# Patient Record
Sex: Female | Born: 1950
Health system: Southern US, Community
[De-identification: ages and names within clinical notes are randomized; demographics above are authoritative.]

## PROBLEM LIST (undated history)

## (undated) DIAGNOSIS — R011 Cardiac murmur, unspecified: Secondary | ICD-10-CM

## (undated) DIAGNOSIS — T7840XA Allergy, unspecified, initial encounter: Secondary | ICD-10-CM

## (undated) DIAGNOSIS — D649 Anemia, unspecified: Secondary | ICD-10-CM

## (undated) HISTORY — DX: Allergy, unspecified, initial encounter: T78.40XA

## (undated) HISTORY — DX: Anemia, unspecified: D64.9

## (undated) HISTORY — DX: Cardiac murmur, unspecified: R01.1

---

## 1969-08-19 HISTORY — PX: SHOULDER SURGERY: SHX246

## 1973-12-19 HISTORY — PX: BREAST BIOPSY: SHX20

## 1998-07-23 ENCOUNTER — Other Ambulatory Visit: Admission: RE | Admit: 1998-07-23 | Discharge: 1998-07-23 | Payer: Self-pay | Admitting: Gynecology

## 1999-07-27 ENCOUNTER — Other Ambulatory Visit: Admission: RE | Admit: 1999-07-27 | Discharge: 1999-07-27 | Payer: Self-pay | Admitting: Gynecology

## 2000-03-03 ENCOUNTER — Encounter: Payer: Self-pay | Admitting: Gynecology

## 2000-03-03 ENCOUNTER — Encounter: Admission: RE | Admit: 2000-03-03 | Discharge: 2000-03-03 | Payer: Self-pay | Admitting: Gynecology

## 2001-03-09 ENCOUNTER — Ambulatory Visit (HOSPITAL_COMMUNITY): Admission: RE | Admit: 2001-03-09 | Discharge: 2001-03-09 | Payer: Self-pay | Admitting: Gynecology

## 2001-03-09 ENCOUNTER — Encounter: Payer: Self-pay | Admitting: Gynecology

## 2001-06-29 ENCOUNTER — Other Ambulatory Visit: Admission: RE | Admit: 2001-06-29 | Discharge: 2001-06-29 | Payer: Self-pay | Admitting: Obstetrics and Gynecology

## 2002-03-14 ENCOUNTER — Ambulatory Visit (HOSPITAL_COMMUNITY): Admission: RE | Admit: 2002-03-14 | Discharge: 2002-03-14 | Payer: Self-pay | Admitting: Obstetrics and Gynecology

## 2002-03-14 ENCOUNTER — Encounter: Payer: Self-pay | Admitting: Obstetrics and Gynecology

## 2002-07-02 ENCOUNTER — Other Ambulatory Visit: Admission: RE | Admit: 2002-07-02 | Discharge: 2002-07-02 | Payer: Self-pay | Admitting: Obstetrics and Gynecology

## 2003-03-17 ENCOUNTER — Ambulatory Visit (HOSPITAL_COMMUNITY): Admission: RE | Admit: 2003-03-17 | Discharge: 2003-03-17 | Payer: Self-pay | Admitting: Family Medicine

## 2003-03-17 ENCOUNTER — Encounter: Payer: Self-pay | Admitting: Family Medicine

## 2003-07-15 ENCOUNTER — Other Ambulatory Visit: Admission: RE | Admit: 2003-07-15 | Discharge: 2003-07-15 | Payer: Self-pay | Admitting: Obstetrics and Gynecology

## 2004-03-18 ENCOUNTER — Ambulatory Visit (HOSPITAL_COMMUNITY): Admission: RE | Admit: 2004-03-18 | Discharge: 2004-03-18 | Payer: Self-pay | Admitting: Obstetrics and Gynecology

## 2004-08-05 ENCOUNTER — Other Ambulatory Visit: Admission: RE | Admit: 2004-08-05 | Discharge: 2004-08-05 | Payer: Self-pay | Admitting: Obstetrics and Gynecology

## 2005-03-28 ENCOUNTER — Ambulatory Visit (HOSPITAL_COMMUNITY): Admission: RE | Admit: 2005-03-28 | Discharge: 2005-03-28 | Payer: Self-pay | Admitting: Obstetrics and Gynecology

## 2005-08-08 ENCOUNTER — Other Ambulatory Visit: Admission: RE | Admit: 2005-08-08 | Discharge: 2005-08-08 | Payer: Self-pay | Admitting: Obstetrics and Gynecology

## 2005-09-22 ENCOUNTER — Ambulatory Visit: Payer: Self-pay | Admitting: Internal Medicine

## 2005-10-07 ENCOUNTER — Encounter (INDEPENDENT_AMBULATORY_CARE_PROVIDER_SITE_OTHER): Payer: Self-pay | Admitting: *Deleted

## 2005-10-07 ENCOUNTER — Ambulatory Visit: Payer: Self-pay | Admitting: Internal Medicine

## 2006-03-30 ENCOUNTER — Ambulatory Visit (HOSPITAL_COMMUNITY): Admission: RE | Admit: 2006-03-30 | Discharge: 2006-03-30 | Payer: Self-pay | Admitting: Obstetrics and Gynecology

## 2006-12-19 HISTORY — PX: HAND SURGERY: SHX662

## 2007-04-18 ENCOUNTER — Ambulatory Visit (HOSPITAL_COMMUNITY): Admission: RE | Admit: 2007-04-18 | Discharge: 2007-04-18 | Payer: Self-pay | Admitting: Obstetrics and Gynecology

## 2008-02-07 ENCOUNTER — Encounter (INDEPENDENT_AMBULATORY_CARE_PROVIDER_SITE_OTHER): Payer: Self-pay | Admitting: Orthopedic Surgery

## 2008-02-07 ENCOUNTER — Ambulatory Visit (HOSPITAL_BASED_OUTPATIENT_CLINIC_OR_DEPARTMENT_OTHER): Admission: RE | Admit: 2008-02-07 | Discharge: 2008-02-07 | Payer: Self-pay | Admitting: Orthopedic Surgery

## 2008-04-24 ENCOUNTER — Ambulatory Visit (HOSPITAL_COMMUNITY): Admission: RE | Admit: 2008-04-24 | Discharge: 2008-04-24 | Payer: Self-pay | Admitting: Obstetrics and Gynecology

## 2009-05-05 ENCOUNTER — Ambulatory Visit (HOSPITAL_COMMUNITY): Admission: RE | Admit: 2009-05-05 | Discharge: 2009-05-05 | Payer: Self-pay | Admitting: Obstetrics and Gynecology

## 2010-05-06 ENCOUNTER — Ambulatory Visit (HOSPITAL_COMMUNITY): Admission: RE | Admit: 2010-05-06 | Discharge: 2010-05-06 | Payer: Self-pay | Admitting: Obstetrics and Gynecology

## 2010-08-20 ENCOUNTER — Encounter: Payer: Self-pay | Admitting: Internal Medicine

## 2011-01-18 NOTE — Letter (Signed)
Summary: Colonoscopy Letter  [facility]  [address] [place], [place] [address]   Phone: [phone]  Fax: [phone]      August 20, 2010 MRN: [id number]   Donna Bauer [address] [place], [place]  [address]   Dear Donna Bauer,   According to your medical record, it is time for you to schedule a Colonoscopy. The American Cancer Society recommends this procedure as a method to detect early colon cancer. Patients with a family history of colon cancer, or a personal history of colon polyps or inflammatory bowel disease are at increased risk.  This letter has been generated based on the recommendations made at the time of your procedure. If you feel that in your particular situation this may no longer apply, please contact our office.  Please call our office at [phone] to schedule this appointment or to update your records at your earliest convenience.  Thank you for cooperating with [place] to provide you with the very best care possible.   Sincerely,  Donna Bauer, M.D.  [facility] [phone]

## 2011-04-11 ENCOUNTER — Other Ambulatory Visit (HOSPITAL_COMMUNITY): Payer: Self-pay | Admitting: Obstetrics and Gynecology

## 2011-04-11 DIAGNOSIS — Z1231 Encounter for screening mammogram for malignant neoplasm of breast: Secondary | ICD-10-CM

## 2011-05-03 NOTE — Op Note (Signed)
[name], Bauer                ACCOUNT NO.:  [id number]   MEDICAL RECORD NO.:  [id number]          PATIENT TYPE:  AMB   LOCATION:  DSC                          FACILITY:  [facility]   PHYSICIAN:  Katy Fitch. Sypher, M.D. DATE OF BIRTH:  Oct 11, 1951   DATE OF PROCEDURE:  02/07/2008  DATE OF DISCHARGE:                               OPERATIVE REPORT   PREOPERATIVE DIAGNOSIS:  Chronic synovitis, right index finger, status  post possible thorn injury in October 2008.   POSTOPERATIVE DIAGNOSIS:  Chronic synovitis, rule out atypical infection  versus crystal synovitis.   OPERATION:  Synovectomy of flexor digitorum profundus and flexor  digitorum superficialis tendons, right index finger, with multiple  biopsy specimens sent for routine histopathology, in absolute alcohol  for crystal examination, aerobic and anaerobic culture, AFB and fungal  culture, AFB and fungal smear.   SURGEON:  Katy Fitch. Sypher, M.D.   ASSISTANT:  Marveen Reeks. Dasnoit, P.A.-C.   ANESTHESIA:  General by LMA.   SUPERVISING ANESTHESIOLOGIST:  Quita Skye. Krista Blue, M.D.   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  She has a history of sustaining a  possible penetrating injury to her right index finger in October 2008  followed by the onset of swelling over the flexor sheath.  She is well  acquainted with Dr. Vear Clock, a semi-retired orthopedic surgeon,  who has seen her socially during the fall of 2008.  He noted that she  had progressive swelling and was concerned that she may have an  infection versus some other type of arthritis disorder.  Dr. Tresa Res  referred her for a hand surgery consult on December 19, 2007.  At that  time, she was noted to have synovitis consistent with a possible  infection.  She reported a possible thorn type injury to the middle  phalangeal segment of the right index finger October 08, 2007. She  removed the thorn with forceps at  home.  She was treated with Levaquin  for a period of time by her primary care physician without complete  resolution.  She subsequently reviewed her predicament with Dr. Tresa Res  and was sent for a hand surgery consult.  We placed her on doxycycline  and had a good clinical response; however, after several days, she  developed marked GI intolerance and had  to stop the doxycycline. She  subsequently developed a progressive swelling in the palm consistent  with synovitis proximal to the A1 pulley.  Therefore, we recommended an  excisional biopsy at this time for crystal exam, atypical mycobacterial  culture, fungal culture, crystal examination and routine histopathology.  Our concern is with a thorn injury she may have atypical mycobacteria or  sporotrichosis.  After informed consent, she is brought to the operating  room at this time.   DESCRIPTION OF PROCEDURE:  Donna Bauer is brought to the operating room  and placed the in supine position on the operating table.  Following an  anesthesia consult with Dr. Krista Blue in the holding area, general  anesthesia by LMA was recommended and  accepted.  She was brought to room  1 of the [facility], placed in the supine position on the  operating table, and under Dr. Robina Ade direct supervision, general  anesthesia by LMA technique induced.  Her right arm was prepped with  Betadine soap solution and sterilely draped.  No prophylactic  antibiotics were provided.  Following exsanguination of the right arm  with an Esmarch bandage, the arterial tourniquet on the proximal  brachium was inflated to 220 mmHg.  The procedure commenced with the  planning of a Brunner zigzag incision from the A4 pulley proximal to the  A1 pulley.   The skin incision was taken sharply and the skin flaps elevated.  The  flexor sheath was immediately noted to be edematous with  neovascularization and synovitis extruding through the retinaculum.  There was a  large synovial mass proximal to the A1 pulley investing the  lumbrical to the index finger.  All of the pathologic synovium was  excised from the level of the A4 pulley distally to proximal to the A1  pulley.  Approximately 4 cubic mL of synovial material was obtained and  was subsequently apportioned into specimens for aerobic and anaerobic  growth, AFB fungal growth and smear, a large fragment was placed in  formalin for histopathologic evaluation, and a fragment was placed in  absolute alcohol for crystal exam.  The lab was notified by phone that  these cultures were coming as well as some synovium in saline for AFB  and fungal culture, as well.   After all the specimens were sent, we meticulously debrided the  pathologic synovium off of the superficialis profundus tendons followed  by hemostasis.  The wound was then inspected for bleeding points  followed by repair with corner sutures of 5-0 nylon and interrupted  sutures of 5-0 nylon.  A compressive dressing was applied with a volar  plaster splint to protect the wrist.  There were no apparent  complications.   Ms. section will be placed on Dilaudid 2 mg 1 p.o. q.4-6h. p.r.n. pain,  30 tablets without refill, as well as Motrin 600 mg 1 p.o. q.6h. p.r.n.  pain, 30 tablets with one refill, and Levaquin 500 mg one p.o. daily x7  days.  Should she have AFB or fungus, we will need an infectious disease  consult and get on poly-drug therapy.      Katy Fitch Sypher, M.D.  Electronically Signed     RVS/MEDQ  D:  02/07/2008  T:  02/08/2008  Job:  [id number]

## 2011-05-09 ENCOUNTER — Ambulatory Visit (HOSPITAL_COMMUNITY)
Admission: RE | Admit: 2011-05-09 | Discharge: 2011-05-09 | Disposition: A | Discharge status: Home / Self Care | Payer: BC Managed Care – PPO | Source: Ambulatory Visit | Attending: Obstetrics and Gynecology | Admitting: Obstetrics and Gynecology

## 2011-05-09 DIAGNOSIS — Z1231 Encounter for screening mammogram for malignant neoplasm of breast: Secondary | ICD-10-CM

## 2011-05-11 ENCOUNTER — Other Ambulatory Visit: Payer: Self-pay | Admitting: Obstetrics and Gynecology

## 2011-05-11 DIAGNOSIS — N6320 Unspecified lump in the left breast, unspecified quadrant: Secondary | ICD-10-CM

## 2011-05-17 ENCOUNTER — Ambulatory Visit
Admission: RE | Admit: 2011-05-17 | Discharge: 2011-05-17 | Disposition: A | Discharge status: Home / Self Care | Payer: BC Managed Care – PPO | Source: Ambulatory Visit | Attending: Obstetrics and Gynecology | Admitting: Obstetrics and Gynecology

## 2011-05-17 DIAGNOSIS — N6320 Unspecified lump in the left breast, unspecified quadrant: Secondary | ICD-10-CM

## 2011-07-01 ENCOUNTER — Encounter: Payer: Self-pay | Admitting: Internal Medicine

## 2011-07-27 ENCOUNTER — Other Ambulatory Visit: Payer: BC Managed Care – PPO | Admitting: Internal Medicine

## 2011-09-09 LAB — TISSUE CULTURE: Gram Stain: NONE SEEN

## 2011-09-09 LAB — FUNGUS CULTURE W SMEAR

## 2011-09-09 LAB — POCT HEMOGLOBIN-HEMACUE: Hemoglobin: 12.8

## 2011-09-09 LAB — ANAEROBIC CULTURE: Gram Stain: NONE SEEN

## 2011-09-09 LAB — AFB CULTURE WITH SMEAR (NOT AT ARMC)

## 2012-05-11 ENCOUNTER — Other Ambulatory Visit: Payer: Self-pay | Admitting: Obstetrics and Gynecology

## 2012-05-11 DIAGNOSIS — Z1231 Encounter for screening mammogram for malignant neoplasm of breast: Secondary | ICD-10-CM

## 2012-05-18 ENCOUNTER — Ambulatory Visit
Admission: RE | Admit: 2012-05-18 | Discharge: 2012-05-18 | Disposition: A | Discharge status: Home / Self Care | Payer: BC Managed Care – PPO | Source: Ambulatory Visit | Attending: Obstetrics and Gynecology | Admitting: Obstetrics and Gynecology

## 2012-05-18 DIAGNOSIS — Z1231 Encounter for screening mammogram for malignant neoplasm of breast: Secondary | ICD-10-CM

## 2012-06-05 ENCOUNTER — Encounter: Payer: Self-pay | Admitting: Internal Medicine

## 2012-07-02 ENCOUNTER — Encounter: Payer: Self-pay | Admitting: Internal Medicine

## 2012-07-02 ENCOUNTER — Ambulatory Visit (AMBULATORY_SURGERY_CENTER): Payer: BC Managed Care – PPO | Admitting: *Deleted

## 2012-07-02 VITALS — Ht 69.0 in | Wt 151.9 lb

## 2012-07-02 DIAGNOSIS — Z86010 Personal history of colonic polyps: Secondary | ICD-10-CM

## 2012-07-02 DIAGNOSIS — Z1211 Encounter for screening for malignant neoplasm of colon: Secondary | ICD-10-CM

## 2012-07-02 MED ORDER — MOVIPREP 100 G PO SOLR: 1.0000 | Freq: Once | ORAL | Status: DC

## 2012-07-09 ENCOUNTER — Ambulatory Visit (AMBULATORY_SURGERY_CENTER): Payer: BC Managed Care – PPO | Admitting: Internal Medicine

## 2012-07-09 ENCOUNTER — Encounter: Payer: Self-pay | Admitting: Internal Medicine

## 2012-07-09 VITALS — BP 149/79 | HR 88 | Temp 97.4°F | Resp 16 | Ht 69.0 in | Wt 151.0 lb

## 2012-07-09 DIAGNOSIS — Z1211 Encounter for screening for malignant neoplasm of colon: Secondary | ICD-10-CM

## 2012-07-09 DIAGNOSIS — Z860100 Personal history of colon polyps, unspecified: Secondary | ICD-10-CM

## 2012-07-09 MED ORDER — SODIUM CHLORIDE 0.9 % IV SOLN: 500.0000 mL | INTRAVENOUS | Status: DC

## 2012-07-09 NOTE — Patient Instructions (Addendum)

## 2012-07-09 NOTE — Op Note (Signed)
[facility] [address] [place], [place]  [address]  COLONOSCOPY PROCEDURE REPORT  PATIENT:  Caliana, Spires  MR#:  [id number] BIRTHDATE:  13-Aug-1951, 60 yrs. old  GENDER:  female ENDOSCOPIST:  Wilhemina Bonito. Eda Keys, MD REF. BY:  Surveillance Program Recall, PROCEDURE DATE:  07/09/2012 PROCEDURE:  Surveillance Colonoscopy ASA CLASS:  Class I INDICATIONS:  history of pre-cancerous (adenomatous) colon polyps, surveillance and high-risk screening ; index 09-2005 w/ small TA MEDICATIONS:   MAC sedation, administered by CRNA, propofol (Diprivan) 300 mg IV  DESCRIPTION OF PROCEDURE:   After the risks benefits and alternatives of the procedure were thoroughly explained, informed consent was obtained.  Digital rectal exam was performed and revealed no abnormalities.   The LB CF-[id number] endoscope was introduced through the anus and advanced to the cecum, which was identified by both the appendix and ileocecal valve, without limitations.  The quality of the prep was excellent, using MoviPrep.  The instrument was then slowly withdrawn as the colon was fully examined. <<PROCEDUREIMAGES>>  FINDINGS:  Mild diverticulosis was found in the sigmoid colon. Otherwise normal colonoscopy without  polyps, masses, vascular ectasias, or inflammatory changes.   Retroflexed views in the rectum revealed internal hemorrhoids.    The time to cecum =  4:01 minutes. The scope was then withdrawn in  8:52  minutes from the cecum and the procedure completed.  COMPLICATIONS:  None  ENDOSCOPIC IMPRESSION: 1) Mild diverticulosis in the sigmoid colon 2) Otherwise normal colonoscopy 3) Internal hemorrhoids  RECOMMENDATIONS: 1) Follow up colonoscopy in 5 years (personal hx adenoma)  ______________________________ Wilhemina Bonito. Eda Keys, MD  CC:  Richardean Chimera, MD;  The Patient  n. eSIGNED:   Wilhemina Bonito. Eda Keys at 07/09/2012 03:15 PM  Johnney Killian, [id number]

## 2012-07-09 NOTE — Progress Notes (Signed)
Patient did not experience any of the following events: a burn prior to discharge; a fall within the facility; wrong site/side/patient/procedure/implant event; or a hospital transfer or hospital admission upon discharge from the facility. (G8907) Patient did not have preoperative order for IV antibiotic SSI prophylaxis. (G8918)  

## 2012-07-10 ENCOUNTER — Telehealth: Payer: Self-pay | Admitting: *Deleted

## 2012-07-10 NOTE — Telephone Encounter (Signed)
  Follow up Call-  Call back number 07/09/2012  Post procedure Call Back phone  # [phone]  Permission to leave phone message Yes     Patient questions:  Message left to call us if necessary.

## 2013-05-04 ENCOUNTER — Ambulatory Visit (INDEPENDENT_AMBULATORY_CARE_PROVIDER_SITE_OTHER): Payer: BC Managed Care – PPO | Admitting: Internal Medicine

## 2013-05-04 VITALS — BP 156/84 | HR 100 | Temp 98.2°F | Resp 16 | Ht 69.0 in | Wt 151.0 lb

## 2013-05-04 DIAGNOSIS — J019 Acute sinusitis, unspecified: Secondary | ICD-10-CM

## 2013-05-04 MED ORDER — AZITHROMYCIN 250 MG PO TABS: ORAL_TABLET | ORAL | Status: DC

## 2013-05-04 NOTE — Progress Notes (Signed)
  Subjective:    Patient ID: Donna Bauer, female    DOB: 04-10-1951, 62 y.o.   MRN: [id number]  HPI2 weeks uri progr to cong,purulent d/c nonprod cough, fever last 2-3 d No n,v No ST    Review of Systems     Objective:   Physical Exam BP 156/84  Pulse 100  Temp(Src) 98.2 F (36.8 C) (Oral)  Resp 16  Ht 5\' 9"  (1.753 m)  Wt 151 lb (68.493 kg)  BMI 22.29 kg/m2  SpO2 96% tms cl Nares pur d/c//tender max sin oroph clear//no nodes Lungs clear      Assessment & Plan:  Sinusitis Pen allergic  Meds ordered this encounter  Medications  . azithromycin (ZITHROMAX) 250 MG tablet    Sig: As directed    Dispense:  6 tablet    Refill:  0

## 2013-05-05 ENCOUNTER — Encounter: Payer: Self-pay | Admitting: Internal Medicine

## 2013-06-05 ENCOUNTER — Other Ambulatory Visit: Payer: Self-pay

## 2013-06-05 DIAGNOSIS — Z1231 Encounter for screening mammogram for malignant neoplasm of breast: Secondary | ICD-10-CM

## 2013-06-06 ENCOUNTER — Ambulatory Visit
Admission: RE | Admit: 2013-06-06 | Discharge: 2013-06-06 | Disposition: A | Discharge status: Home / Self Care | Payer: BC Managed Care – PPO | Source: Ambulatory Visit

## 2013-06-06 DIAGNOSIS — Z1231 Encounter for screening mammogram for malignant neoplasm of breast: Secondary | ICD-10-CM

## 2014-05-28 ENCOUNTER — Other Ambulatory Visit: Payer: Self-pay

## 2014-05-28 DIAGNOSIS — Z1231 Encounter for screening mammogram for malignant neoplasm of breast: Secondary | ICD-10-CM

## 2014-06-19 ENCOUNTER — Ambulatory Visit
Admission: RE | Admit: 2014-06-19 | Discharge: 2014-06-19 | Disposition: A | Discharge status: Home / Self Care | Payer: BC Managed Care – PPO | Source: Ambulatory Visit

## 2014-06-19 DIAGNOSIS — Z1231 Encounter for screening mammogram for malignant neoplasm of breast: Secondary | ICD-10-CM

## 2015-06-25 ENCOUNTER — Other Ambulatory Visit: Payer: Self-pay

## 2015-06-25 DIAGNOSIS — Z1231 Encounter for screening mammogram for malignant neoplasm of breast: Secondary | ICD-10-CM

## 2015-07-23 ENCOUNTER — Ambulatory Visit
Admission: RE | Admit: 2015-07-23 | Discharge: 2015-07-23 | Disposition: A | Discharge status: Home / Self Care | Payer: BC Managed Care – PPO | Source: Ambulatory Visit

## 2015-07-23 DIAGNOSIS — Z1231 Encounter for screening mammogram for malignant neoplasm of breast: Secondary | ICD-10-CM

## 2016-06-27 ENCOUNTER — Other Ambulatory Visit: Payer: Self-pay | Admitting: Obstetrics and Gynecology

## 2016-06-27 DIAGNOSIS — Z1231 Encounter for screening mammogram for malignant neoplasm of breast: Secondary | ICD-10-CM

## 2016-07-25 ENCOUNTER — Ambulatory Visit
Admission: RE | Admit: 2016-07-25 | Discharge: 2016-07-25 | Disposition: A | Discharge status: Home / Self Care | Payer: BC Managed Care – PPO | Source: Ambulatory Visit | Attending: Obstetrics and Gynecology | Admitting: Obstetrics and Gynecology

## 2016-07-25 DIAGNOSIS — Z1231 Encounter for screening mammogram for malignant neoplasm of breast: Secondary | ICD-10-CM

## 2016-07-26 ENCOUNTER — Other Ambulatory Visit: Payer: Self-pay | Admitting: Obstetrics and Gynecology

## 2016-07-26 DIAGNOSIS — R928 Other abnormal and inconclusive findings on diagnostic imaging of breast: Secondary | ICD-10-CM

## 2016-07-28 ENCOUNTER — Ambulatory Visit
Admission: RE | Admit: 2016-07-28 | Discharge: 2016-07-28 | Disposition: A | Discharge status: Home / Self Care | Payer: BC Managed Care – PPO | Source: Ambulatory Visit | Attending: Obstetrics and Gynecology | Admitting: Obstetrics and Gynecology

## 2016-07-28 DIAGNOSIS — R928 Other abnormal and inconclusive findings on diagnostic imaging of breast: Secondary | ICD-10-CM

## 2016-11-30 ENCOUNTER — Ambulatory Visit (INDEPENDENT_AMBULATORY_CARE_PROVIDER_SITE_OTHER): Payer: BC Managed Care – PPO | Admitting: Physician Assistant

## 2016-11-30 VITALS — BP 140/90 | HR 101 | Temp 97.8°F | Resp 17 | Ht 69.0 in | Wt 148.0 lb

## 2016-11-30 DIAGNOSIS — J069 Acute upper respiratory infection, unspecified: Secondary | ICD-10-CM

## 2016-11-30 DIAGNOSIS — B9789 Other viral agents as the cause of diseases classified elsewhere: Secondary | ICD-10-CM

## 2016-11-30 MED ORDER — GENTAMICIN SULFATE 0.3 % OP SOLN: 1.0000 [drp] | Freq: Three times a day (TID) | OPHTHALMIC | 0 refills | Status: DC

## 2016-11-30 MED ORDER — BENZONATATE 100 MG PO CAPS: ORAL_CAPSULE | ORAL | 0 refills | Status: AC

## 2016-11-30 MED ORDER — HYDROCODONE-HOMATROPINE 5-1.5 MG/5ML PO SYRP: 5.0000 mL | ORAL_SOLUTION | Freq: Three times a day (TID) | ORAL | 0 refills | Status: DC | PRN

## 2016-11-30 NOTE — Patient Instructions (Addendum)
Mucinex (blue box) 600mg  bid Nasal saline  Hold onto the eye drops if your eye does not get better in the next 2-3 days start the drops  Please push fluids.  Tylenol and Motrin for fever and body aches.    A humidifier can help especially when the air is dry -if you do not have a humidifier you can boil a pot of water on the stove in your home to help with the dry air.  Nasal saline spray can be helpful to keep the mucus membranes moist and thin the nasal mucus   IF you received an x-ray today, you will receive an invoice from [facility]. Please contact [facility] at [phone] with questions or concerns regarding your invoice.   IF you received labwork today, you will receive an invoice from [employer]. Please contact [facility] at [phone] with questions or concerns regarding your invoice.   Our billing staff will not be able to assist you with questions regarding bills from these companies.  You will be contacted with the lab results as soon as they are available. The fastest way to get your results is to activate your My Chart account. Instructions are located on the last page of this paperwork. If you have not heard from [place] regarding the results in 2 weeks, please contact this office.

## 2016-11-30 NOTE — Progress Notes (Signed)
Donna Bauer  MRN: [id number] DOB: 1951/09/28  Subjective:  Pt presents to clinic with cold symptoms for about a week.  Started with laryngitis that turned into a cough and fever.  She rested for several days but she has not gotten much better.  She is having some eye drainage with matting in the am that was worse 3 days ago and seems to be getting better.  The cough was bad last night disrupting her sleep.  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 Negative for chills.  HENT: Positive for congestion, sore throat (resolved) and voice change. Negative for postnasal drip.   Respiratory: Positive for cough. Negative for shortness of breath and wheezing.        No h/o asthma, nonsmoker    There are no active problems to display for this patient.   Current Outpatient Prescriptions on File Prior to Visit  Medication Sig Dispense Refill  . Ascorbic Acid (VITAMIN C) 1000 MG tablet Take 1,000 mg by mouth daily.    . calcium citrate (CALCITRATE - DOSED IN MG ELEMENTAL CALCIUM) 950 MG tablet Take 1 tablet by mouth daily.    . Cholecalciferol (VITAMIN D-3 PO) Take 2,000 Units by mouth daily.    Marland Kitchen co-enzyme Q-10 30 MG capsule Take 50 mg by mouth daily.    . Evening Primrose Oil 500 MG CAPS Take 500 mg by mouth daily.    . fish oil-omega-3 fatty acids 1000 MG capsule Take 2 g by mouth daily.    . Probiotic Product (PROBIOTIC DAILY PO) Take by mouth daily.     No current facility-administered medications on file prior to visit.     Allergies  Allergen Reactions  . Penicillins Anaphylaxis    Pt patients past, family and social history were reviewed and updated.   Objective:  BP 140/90   Pulse (!) 101   Temp 97.8 F (36.6 C) (Oral)   Resp 17   Ht 5\' 9"  (1.753 m)   Wt 148 lb (67.1 kg)   SpO2 96%   BMI 21.86 kg/m   Physical Exam  Constitutional: She is oriented to person, place, and time and  well-developed, well-nourished, and in no distress.  HENT:  Head: Normocephalic and atraumatic.  Right Ear: Hearing, external ear and ear canal normal. A middle ear effusion (serous) is present.  Left Ear: Hearing, external ear and ear canal normal. A middle ear effusion (serous) is present.  Nose: Mucosal edema (red) present.  Mouth/Throat: Uvula is midline, oropharynx is clear and moist and mucous membranes are normal.  Eyes: EOM are normal. Pupils are equal, round, and reactive to light. Right eye exhibits discharge (white thick - mucus like d/c). Left eye exhibits no discharge. Right conjunctiva is injected (mild). Left conjunctiva is injected (mild).  Neck: Normal range of motion.  Cardiovascular: Normal rate, regular rhythm and normal heart sounds.   No murmur heard. Pulmonary/Chest: Effort normal and breath sounds normal.  Neurological: She is alert and oriented to person, place, and time. Gait normal.  Skin: Skin is warm and dry.  Psychiatric: Mood, memory, affect and judgment normal.  Vitals reviewed.   Assessment and Plan :  Viral URI with cough - Plan: benzonatate (TESSALON) 100 MG capsule, HYDROcodone-homatropine (HYCODAN) 5-1.5 MG/5ML syrup, gentamicin (GARAMYCIN) 0.3 % ophthalmic solution - symptomatic care d/w - she will use warm compresses on the eye - I gave her eye drops to  Use if her eye does not get better in the next several days but I suspect this will continue to get better as it has over the last couple of days.  Windell Hummingbird PA-C  [facility] 11/30/2016 2:55 PM

## 2017-05-10 ENCOUNTER — Encounter: Payer: Self-pay | Admitting: Internal Medicine

## 2017-06-19 ENCOUNTER — Other Ambulatory Visit: Payer: Self-pay | Admitting: Obstetrics and Gynecology

## 2017-06-19 DIAGNOSIS — Z1231 Encounter for screening mammogram for malignant neoplasm of breast: Secondary | ICD-10-CM

## 2017-07-26 ENCOUNTER — Ambulatory Visit
Admission: RE | Admit: 2017-07-26 | Discharge: 2017-07-26 | Disposition: A | Discharge status: Home / Self Care | Payer: BC Managed Care – PPO | Source: Ambulatory Visit | Attending: Obstetrics and Gynecology | Admitting: Obstetrics and Gynecology

## 2017-07-26 DIAGNOSIS — Z1231 Encounter for screening mammogram for malignant neoplasm of breast: Secondary | ICD-10-CM

## 2018-10-17 ENCOUNTER — Other Ambulatory Visit: Payer: Self-pay | Admitting: Obstetrics and Gynecology

## 2018-10-17 DIAGNOSIS — Z1231 Encounter for screening mammogram for malignant neoplasm of breast: Secondary | ICD-10-CM

## 2018-10-31 DIAGNOSIS — Z01419 Encounter for gynecological examination (general) (routine) without abnormal findings: Secondary | ICD-10-CM | POA: Diagnosis not present

## 2018-10-31 DIAGNOSIS — Z124 Encounter for screening for malignant neoplasm of cervix: Secondary | ICD-10-CM | POA: Diagnosis not present

## 2018-10-31 DIAGNOSIS — Z6821 Body mass index (BMI) 21.0-21.9, adult: Secondary | ICD-10-CM | POA: Diagnosis not present

## 2018-11-06 ENCOUNTER — Other Ambulatory Visit (HOSPITAL_COMMUNITY): Payer: Self-pay | Admitting: Obstetrics and Gynecology

## 2018-11-06 DIAGNOSIS — R011 Cardiac murmur, unspecified: Secondary | ICD-10-CM

## 2018-11-22 ENCOUNTER — Ambulatory Visit (HOSPITAL_COMMUNITY)
Admission: RE | Admit: 2018-11-22 | Discharge: 2018-11-22 | Disposition: A | Discharge status: Home / Self Care | Payer: PPO | Source: Ambulatory Visit | Attending: Obstetrics and Gynecology | Admitting: Obstetrics and Gynecology

## 2018-11-22 DIAGNOSIS — I340 Nonrheumatic mitral (valve) insufficiency: Secondary | ICD-10-CM | POA: Insufficient documentation

## 2018-11-22 DIAGNOSIS — R011 Cardiac murmur, unspecified: Secondary | ICD-10-CM | POA: Diagnosis not present

## 2018-11-22 DIAGNOSIS — I371 Nonrheumatic pulmonary valve insufficiency: Secondary | ICD-10-CM | POA: Diagnosis not present

## 2018-11-22 NOTE — Progress Notes (Signed)
  Echocardiogram 2D Echocardiogram has been performed.  Donna Bauer Donna Bauer 11/22/2018, 3:52 PM

## 2018-11-28 ENCOUNTER — Ambulatory Visit
Admission: RE | Admit: 2018-11-28 | Discharge: 2018-11-28 | Disposition: A | Discharge status: Home / Self Care | Payer: BC Managed Care – PPO | Source: Ambulatory Visit | Attending: Obstetrics and Gynecology | Admitting: Obstetrics and Gynecology

## 2018-11-28 DIAGNOSIS — Z1231 Encounter for screening mammogram for malignant neoplasm of breast: Secondary | ICD-10-CM | POA: Diagnosis not present

## 2019-01-22 DIAGNOSIS — Z13220 Encounter for screening for lipoid disorders: Secondary | ICD-10-CM | POA: Diagnosis not present

## 2019-01-22 DIAGNOSIS — Z808 Family history of malignant neoplasm of other organs or systems: Secondary | ICD-10-CM | POA: Diagnosis not present

## 2019-01-22 DIAGNOSIS — Z131 Encounter for screening for diabetes mellitus: Secondary | ICD-10-CM | POA: Diagnosis not present

## 2019-01-22 DIAGNOSIS — Z1211 Encounter for screening for malignant neoplasm of colon: Secondary | ICD-10-CM | POA: Diagnosis not present

## 2019-01-22 DIAGNOSIS — Z0000 Encounter for general adult medical examination without abnormal findings: Secondary | ICD-10-CM | POA: Diagnosis not present

## 2019-02-25 ENCOUNTER — Encounter: Payer: Self-pay | Admitting: Internal Medicine

## 2019-04-16 ENCOUNTER — Encounter: Payer: BC Managed Care – PPO | Admitting: Internal Medicine

## 2019-09-24 DIAGNOSIS — H52223 Regular astigmatism, bilateral: Secondary | ICD-10-CM | POA: Diagnosis not present

## 2019-09-24 DIAGNOSIS — H5203 Hypermetropia, bilateral: Secondary | ICD-10-CM | POA: Diagnosis not present

## 2019-09-24 DIAGNOSIS — H04123 Dry eye syndrome of bilateral lacrimal glands: Secondary | ICD-10-CM | POA: Diagnosis not present

## 2019-09-24 DIAGNOSIS — H524 Presbyopia: Secondary | ICD-10-CM | POA: Diagnosis not present

## 2019-09-24 DIAGNOSIS — H2513 Age-related nuclear cataract, bilateral: Secondary | ICD-10-CM | POA: Diagnosis not present

## 2019-09-24 DIAGNOSIS — D3142 Benign neoplasm of left ciliary body: Secondary | ICD-10-CM | POA: Diagnosis not present

## 2019-09-24 DIAGNOSIS — D3131 Benign neoplasm of right choroid: Secondary | ICD-10-CM | POA: Diagnosis not present

## 2019-10-01 ENCOUNTER — Encounter: Payer: Self-pay | Admitting: Internal Medicine

## 2019-10-25 ENCOUNTER — Other Ambulatory Visit: Payer: Self-pay | Admitting: Family Medicine

## 2019-10-25 DIAGNOSIS — Z1231 Encounter for screening mammogram for malignant neoplasm of breast: Secondary | ICD-10-CM

## 2019-11-08 ENCOUNTER — Ambulatory Visit (AMBULATORY_SURGERY_CENTER): Payer: Self-pay

## 2019-11-08 ENCOUNTER — Other Ambulatory Visit: Payer: Self-pay

## 2019-11-08 ENCOUNTER — Ambulatory Visit: Payer: PPO

## 2019-11-08 VITALS — Temp 96.8°F | Ht 69.0 in | Wt 142.0 lb

## 2019-11-08 DIAGNOSIS — Z1159 Encounter for screening for other viral diseases: Secondary | ICD-10-CM

## 2019-11-08 DIAGNOSIS — Z86010 Personal history of colonic polyps: Secondary | ICD-10-CM

## 2019-11-08 MED ORDER — NA SULFATE-K SULFATE-MG SULF 17.5-3.13-1.6 GM/177ML PO SOLN: 1.0000 | Freq: Once | ORAL | 0 refills | Status: AC

## 2019-11-08 NOTE — Progress Notes (Signed)
Denies allergies to eggs or soy products. Denies complication of anesthesia or sedation. Denies use of weight loss medication. Denies use of O2.   Emmi instructions given for colonoscopy.  Covid screening on 11/19/19 @ 11:30 Am. A 15.00 coupon for Suprep was given to the patient.

## 2019-11-19 ENCOUNTER — Other Ambulatory Visit: Payer: Self-pay | Admitting: Internal Medicine

## 2019-11-19 DIAGNOSIS — Z1159 Encounter for screening for other viral diseases: Secondary | ICD-10-CM | POA: Diagnosis not present

## 2019-11-20 LAB — SARS CORONAVIRUS 2 (TAT 6-24 HRS): SARS Coronavirus 2: NEGATIVE

## 2019-11-22 ENCOUNTER — Ambulatory Visit (AMBULATORY_SURGERY_CENTER): Payer: PPO | Admitting: Internal Medicine

## 2019-11-22 ENCOUNTER — Other Ambulatory Visit: Payer: Self-pay

## 2019-11-22 ENCOUNTER — Encounter: Payer: Self-pay | Admitting: Internal Medicine

## 2019-11-22 VITALS — BP 115/51 | HR 59 | Temp 99.1°F | Resp 13 | Ht 69.0 in | Wt 142.0 lb

## 2019-11-22 DIAGNOSIS — Z1211 Encounter for screening for malignant neoplasm of colon: Secondary | ICD-10-CM | POA: Diagnosis not present

## 2019-11-22 DIAGNOSIS — Z86010 Personal history of colonic polyps: Secondary | ICD-10-CM | POA: Diagnosis not present

## 2019-11-22 DIAGNOSIS — D124 Benign neoplasm of descending colon: Secondary | ICD-10-CM | POA: Diagnosis not present

## 2019-11-22 DIAGNOSIS — K635 Polyp of colon: Secondary | ICD-10-CM

## 2019-11-22 MED ORDER — SODIUM CHLORIDE 0.9 % IV SOLN: 500.0000 mL | INTRAVENOUS | Status: DC

## 2019-11-22 NOTE — Op Note (Signed)
[facility] Patient Name: Donna Bauer Procedure Date: 11/22/2019 11:08 AM MRN: [id number] Endoscopist: Docia Chuck. Henrene Pastor , MD Age: 68 Referring MD:  Date of Birth: 1951-02-09 Gender: Female Account #: [id number] Procedure:                Colonoscopy with cold snare polypectomy x 1 Indications:              High risk colon cancer surveillance: Personal                            history of non-advanced adenoma. Previous                            examinations 2006, 2013 Medicines:                Monitored Anesthesia Care Procedure:                Pre-Anesthesia Assessment:                           - Prior to the procedure, a History and Physical                            was performed, and patient medications and                            allergies were reviewed. The patient's tolerance of                            previous anesthesia was also reviewed. The risks                            and benefits of the procedure and the sedation                            options and risks were discussed with the patient.                            All questions were answered, and informed consent                            was obtained. Prior Anticoagulants: The patient has                            taken no previous anticoagulant or antiplatelet                            agents. ASA Grade Assessment: I - A normal, healthy                            patient. After reviewing the risks and benefits,                            the patient was deemed in satisfactory condition to  undergo the procedure.                           After obtaining informed consent, the colonoscope                            was passed under direct vision. Throughout the                            procedure, the patient's blood pressure, pulse, and                            oxygen saturations were monitored continuously. The                            Colonoscope was introduced  through the anus and                            advanced to the the cecum, identified by                            appendiceal orifice and ileocecal valve. The                            ileocecal valve, appendiceal orifice, and rectum                            were photographed. The quality of the bowel                            preparation was excellent. The colonoscopy was                            performed without difficulty. The patient tolerated                            the procedure well. The bowel preparation used was                            SUPREP via split dose instruction. Scope In: 11:20:10 AM Scope Out: 11:33:47 AM Scope Withdrawal Time: 0 hours 9 minutes 12 seconds  Total Procedure Duration: 0 hours 13 minutes 37 seconds  Findings:                 A 3 mm polyp was found in the descending colon. The                            polyp was removed with a cold snare. Resection and                            retrieval were complete.                           A few small-mouthed diverticula were found in the  sigmoid colon.                           Internal hemorrhoids were found during retroflexion.                           The exam was otherwise without abnormality on                            direct and retroflexion views. Complications:            No immediate complications. Estimated blood loss:                            None. Estimated Blood Loss:     Estimated blood loss: none. Impression:               - One 3 mm polyp in the descending colon, removed                            with a cold snare. Resected and retrieved.                           - Diverticulosis in the sigmoid colon.                           - Internal hemorrhoids.                           - The examination was otherwise normal on direct                            and retroflexion views. Recommendation:           - Repeat colonoscopy in 10 years for surveillance.                            - Patient has a contact number available for                            emergencies. The signs and symptoms of potential                            delayed complications were discussed with the                            patient. Return to normal activities tomorrow.                            Written discharge instructions were provided to the                            patient.                           - Resume previous diet.                           -  Continue present medications.                           - Await pathology results. Docia Chuck. Henrene Pastor, MD 11/22/2019 11:40:10 AM This report has been signed electronically.

## 2019-11-22 NOTE — Progress Notes (Signed)
Report to PACU, RN, vss, BBS= Clear.  

## 2019-11-22 NOTE — Progress Notes (Signed)
Vs [place]  temp JB I have reviewed the patient's medical history in detail and updated the computerized patient record.

## 2019-11-22 NOTE — Patient Instructions (Signed)
Thank you for allowing [place] to care for you today!  Await pathology of single polyp, approximately 1-2 weeks.   Will notify you by  mail.    Resume previous diet and medications today.  Return to your normal activities tomorrow.    YOU HAD AN ENDOSCOPIC PROCEDURE TODAY AT [facility]:   Refer to the procedure report that was given to you for any specific questions about what was found during the examination.  If the procedure report does not answer your questions, please call your gastroenterologist to clarify.  If you requested that your care partner not be given the details of your procedure findings, then the procedure report has been included in a sealed envelope for you to review at your convenience later.  YOU SHOULD EXPECT: Some feelings of bloating in the abdomen. Passage of more gas than usual.  Walking can help get rid of the air that was put into your GI tract during the procedure and reduce the bloating. If you had a lower endoscopy (such as a colonoscopy or flexible sigmoidoscopy) you may notice spotting of blood in your stool or on the toilet paper. If you underwent a bowel prep for your procedure, you may not have a normal bowel movement for a few days.  Please Note:  You might notice some irritation and congestion in your nose or some drainage.  This is from the oxygen used during your procedure.  There is no need for concern and it should clear up in a day or so.  SYMPTOMS TO REPORT IMMEDIATELY:   Following lower endoscopy (colonoscopy or flexible sigmoidoscopy):  Excessive amounts of blood in the stool  Significant tenderness or worsening of abdominal pains  Swelling of the abdomen that is new, acute  Fever of 100F or higher   For urgent or emergent issues, a gastroenterologist can be reached at any hour by calling [phone].   DIET:  We do recommend a small meal at first, but then you may proceed to your regular diet.  Drink plenty of fluids but you  should avoid alcoholic beverages for 24 hours.  ACTIVITY:  You should plan to take it easy for the rest of today and you should NOT DRIVE or use heavy machinery until tomorrow (because of the sedation medicines used during the test).    FOLLOW UP: Our staff will call the number listed on your records 48-72 hours following your procedure to check on you and address any questions or concerns that you may have regarding the information given to you following your procedure. If we do not reach you, we will leave a message.  We will attempt to reach you two times.  During this call, we will ask if you have developed any symptoms of COVID 19. If you develop any symptoms (ie: fever, flu-like symptoms, shortness of breath, cough etc.) before then, please call [phone].  If you test positive for Covid 19 in the 2 weeks post procedure, please call and report this information to [place].    If any biopsies were taken you will be contacted by phone or by letter within the next 1-3 weeks.  Please call us at [phone] if you have not heard about the biopsies in 3 weeks.    SIGNATURES/CONFIDENTIALITY: You and/or your care partner have signed paperwork which will be entered into your electronic medical record.  These signatures attest to the fact that that the information above on your After Visit Summary has been reviewed  and is understood.  Full responsibility of the confidentiality of this discharge information lies with you and/or your care-partner. 

## 2019-11-26 ENCOUNTER — Telehealth: Payer: Self-pay | Admitting: *Deleted

## 2019-11-26 ENCOUNTER — Encounter: Payer: Self-pay | Admitting: Internal Medicine

## 2019-11-26 NOTE — Telephone Encounter (Signed)
  Follow up Call-  Call back number 11/22/2019  Post procedure Call Back phone  # [phone]  Permission to leave phone message Yes  Some recent data might be hidden     Patient questions:  Do you have a fever, pain , or abdominal swelling? No. Pain Score  0 *  Have you tolerated food without any problems? Yes.    Have you been able to return to your normal activities? Yes.    Do you have any questions about your discharge instructions: Diet   No. Medications  No. Follow up visit  No.  Do you have questions or concerns about your Care? No.  Actions: * If pain score is 4 or above: 1. No action needed, pain <4.Have you developed a fever since your procedure? no  2.   Have you had an respiratory symptoms (SOB or cough) since your procedure? no  3.   Have you tested positive for COVID 19 since your procedure no  4.   Have you had any family members/close contacts diagnosed with the COVID 19 since your procedure?  no   If yes to any of these questions please route to Joylene John, RN and Alphonsa Gin, Therapist, sports.

## 2019-12-03 ENCOUNTER — Ambulatory Visit
Admission: RE | Admit: 2019-12-03 | Discharge: 2019-12-03 | Disposition: A | Discharge status: Home / Self Care | Payer: PPO | Source: Ambulatory Visit | Attending: Family Medicine | Admitting: Family Medicine

## 2019-12-03 ENCOUNTER — Other Ambulatory Visit: Payer: Self-pay

## 2019-12-03 DIAGNOSIS — Z1231 Encounter for screening mammogram for malignant neoplasm of breast: Secondary | ICD-10-CM | POA: Diagnosis not present

## 2019-12-09 DIAGNOSIS — R600 Localized edema: Secondary | ICD-10-CM | POA: Diagnosis not present

## 2019-12-09 DIAGNOSIS — T7840XA Allergy, unspecified, initial encounter: Secondary | ICD-10-CM | POA: Diagnosis not present

## 2019-12-19 ENCOUNTER — Ambulatory Visit: Payer: BC Managed Care – PPO

## 2020-04-20 DIAGNOSIS — R739 Hyperglycemia, unspecified: Secondary | ICD-10-CM | POA: Diagnosis not present

## 2020-04-20 DIAGNOSIS — J302 Other seasonal allergic rhinitis: Secondary | ICD-10-CM | POA: Diagnosis not present

## 2020-04-20 DIAGNOSIS — Z0000 Encounter for general adult medical examination without abnormal findings: Secondary | ICD-10-CM | POA: Diagnosis not present

## 2020-04-20 DIAGNOSIS — Z23 Encounter for immunization: Secondary | ICD-10-CM | POA: Diagnosis not present

## 2020-04-20 DIAGNOSIS — M8588 Other specified disorders of bone density and structure, other site: Secondary | ICD-10-CM | POA: Diagnosis not present

## 2020-04-20 DIAGNOSIS — Z136 Encounter for screening for cardiovascular disorders: Secondary | ICD-10-CM | POA: Diagnosis not present

## 2020-07-21 DIAGNOSIS — R7303 Prediabetes: Secondary | ICD-10-CM | POA: Diagnosis not present

## 2020-10-22 ENCOUNTER — Other Ambulatory Visit: Payer: Self-pay | Admitting: Family Medicine

## 2020-10-22 DIAGNOSIS — Z1231 Encounter for screening mammogram for malignant neoplasm of breast: Secondary | ICD-10-CM

## 2020-11-24 DIAGNOSIS — H524 Presbyopia: Secondary | ICD-10-CM | POA: Diagnosis not present

## 2020-11-24 DIAGNOSIS — H5203 Hypermetropia, bilateral: Secondary | ICD-10-CM | POA: Diagnosis not present

## 2020-11-24 DIAGNOSIS — H2513 Age-related nuclear cataract, bilateral: Secondary | ICD-10-CM | POA: Diagnosis not present

## 2020-11-24 DIAGNOSIS — H04123 Dry eye syndrome of bilateral lacrimal glands: Secondary | ICD-10-CM | POA: Diagnosis not present

## 2020-11-24 DIAGNOSIS — H52223 Regular astigmatism, bilateral: Secondary | ICD-10-CM | POA: Diagnosis not present

## 2020-11-24 DIAGNOSIS — D3142 Benign neoplasm of left ciliary body: Secondary | ICD-10-CM | POA: Diagnosis not present

## 2020-11-24 DIAGNOSIS — D3131 Benign neoplasm of right choroid: Secondary | ICD-10-CM | POA: Diagnosis not present

## 2020-12-03 ENCOUNTER — Other Ambulatory Visit: Payer: Self-pay

## 2020-12-03 ENCOUNTER — Ambulatory Visit
Admission: RE | Admit: 2020-12-03 | Discharge: 2020-12-03 | Disposition: A | Discharge status: Home / Self Care | Payer: PPO | Source: Ambulatory Visit | Attending: Family Medicine | Admitting: Family Medicine

## 2020-12-03 DIAGNOSIS — Z1231 Encounter for screening mammogram for malignant neoplasm of breast: Secondary | ICD-10-CM | POA: Diagnosis not present

## 2020-12-24 IMAGING — MG DIGITAL SCREENING BILAT W/ TOMO W/ CAD
8 series · 9 of 24 positions shown · non-contrast
Comparison: Previous exam(s).

CLINICAL DATA: Screening.

EXAM:
DIGITAL SCREENING BILATERAL MAMMOGRAM WITH TOMO AND CAD

[R CC synth-2D]
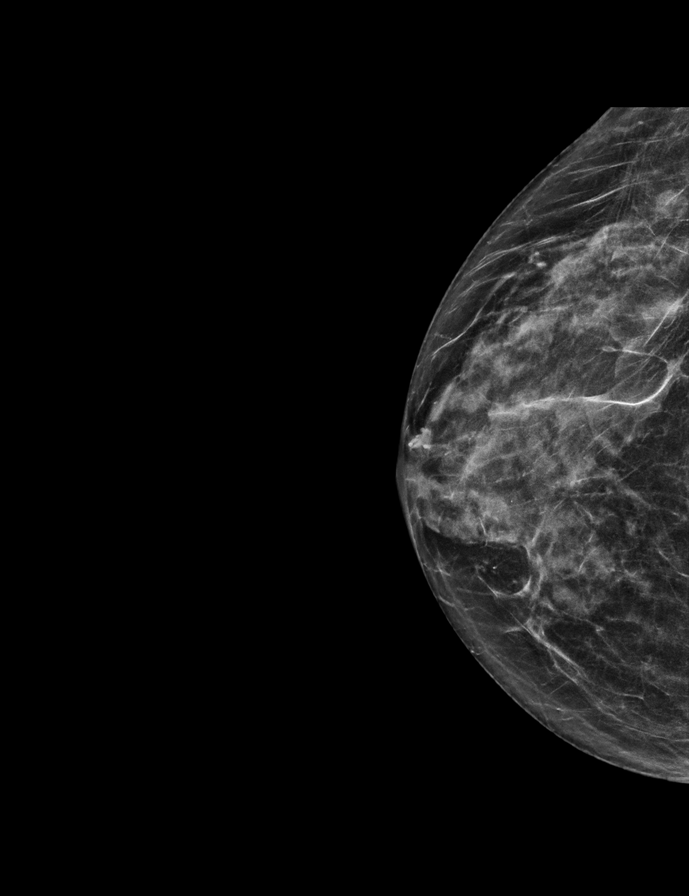

[R MLO synth-2D]
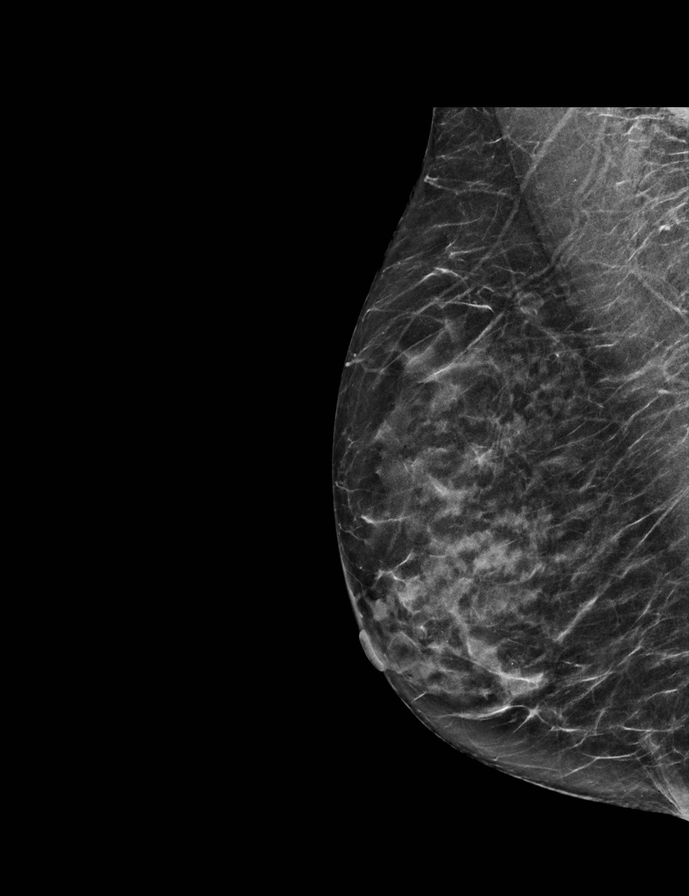

[L CC synth-2D]
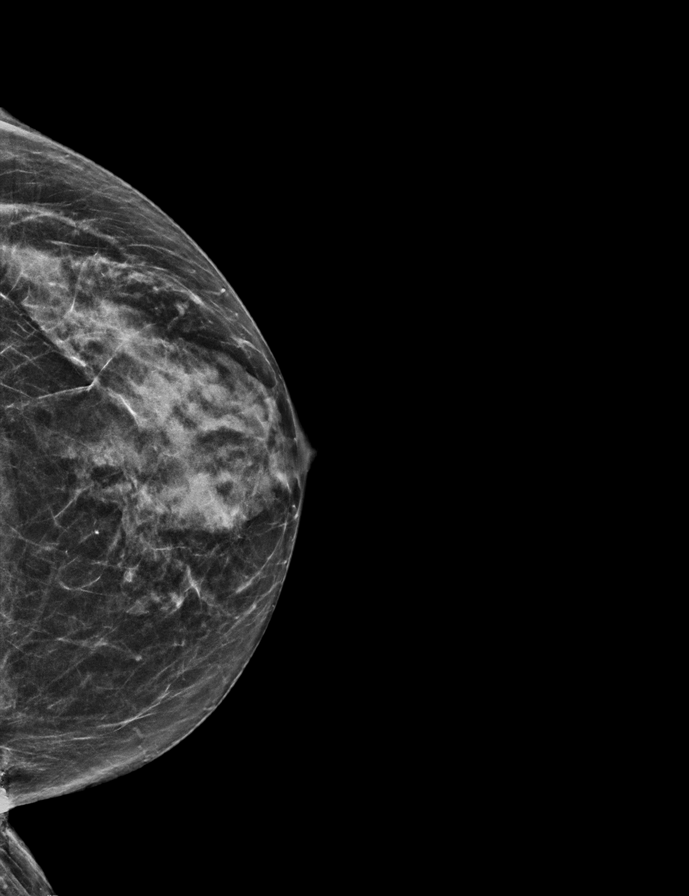

[L MLO synth-2D]
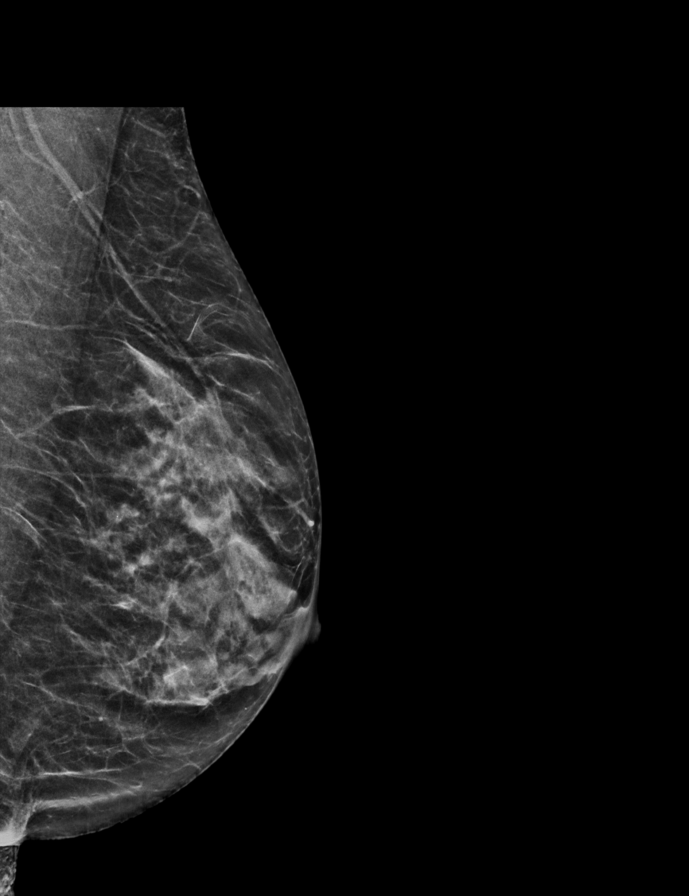

[R CC tomo · 2 of 54 frames shown]
[frame 18/54]
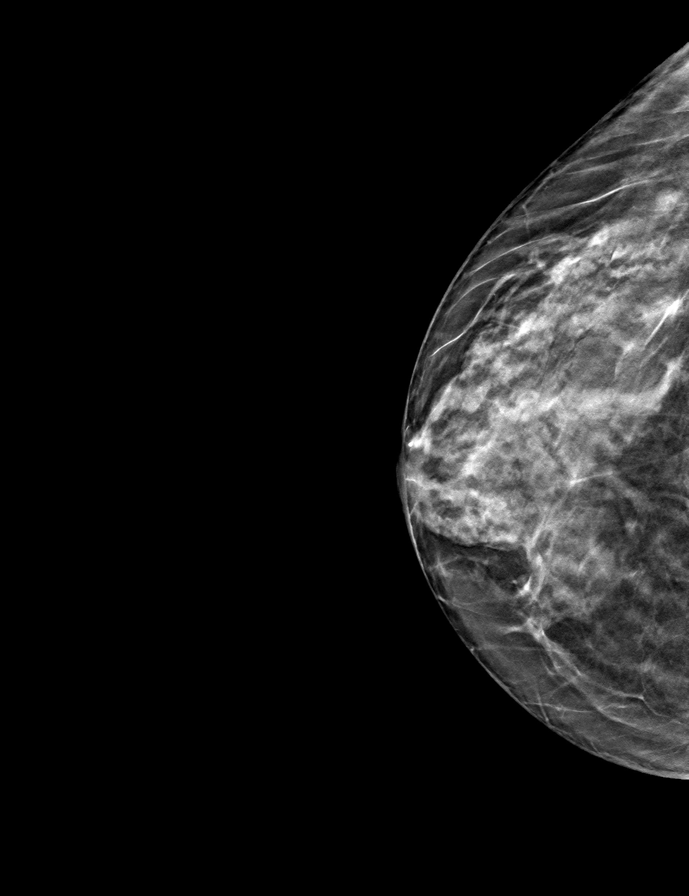
[frame 27/54]
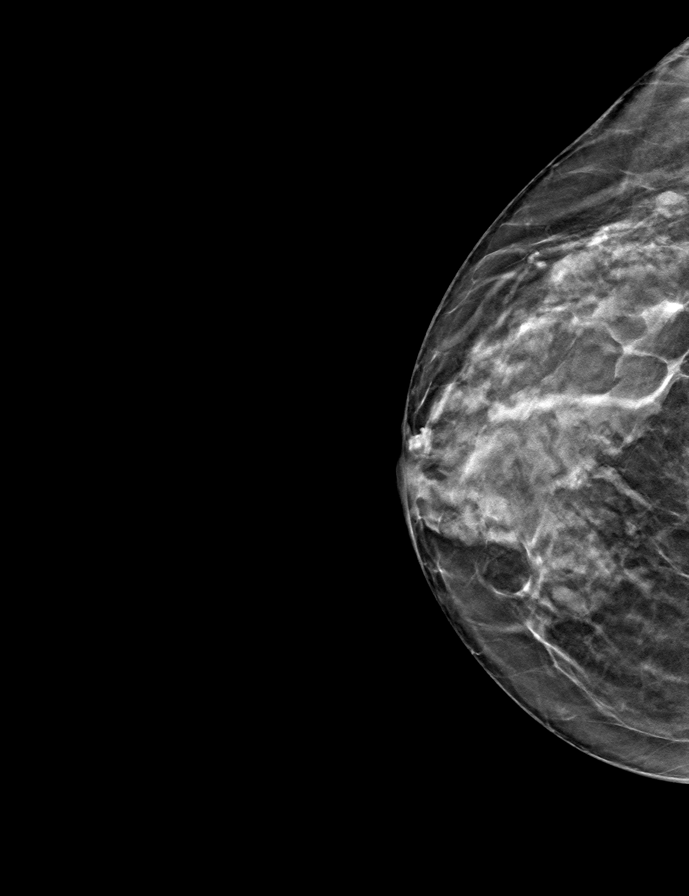

[L MLO tomo · tomo slice 25/49.0]
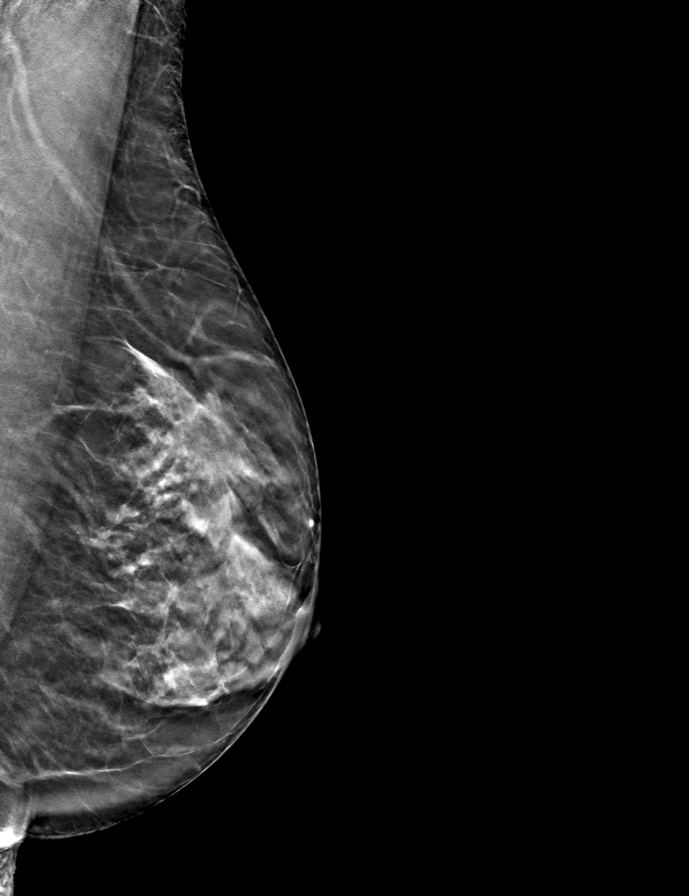

[L CC tomo · tomo slice 25/50.0]
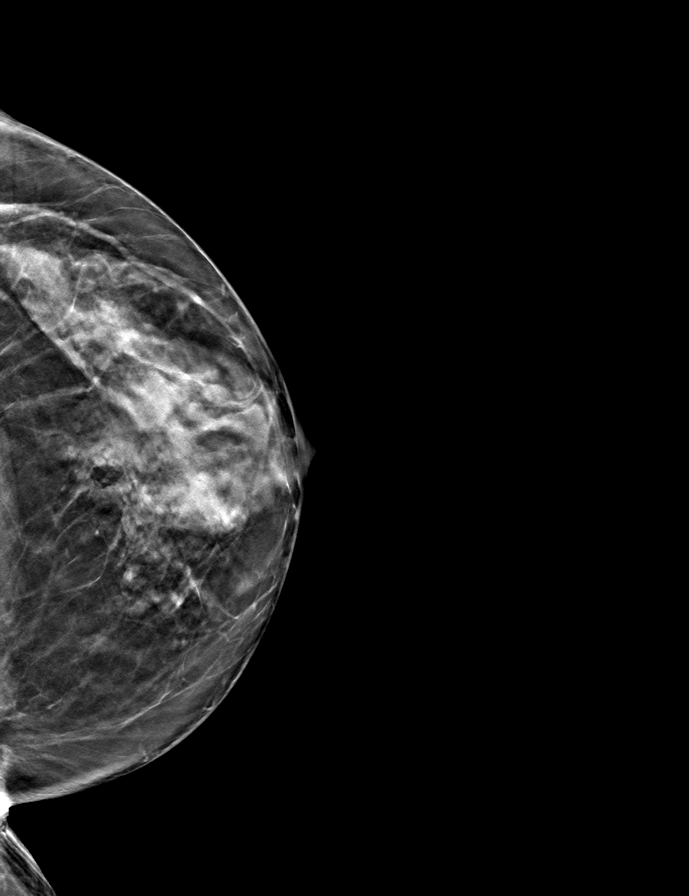

[R MLO tomo · tomo slice 25/50.0]
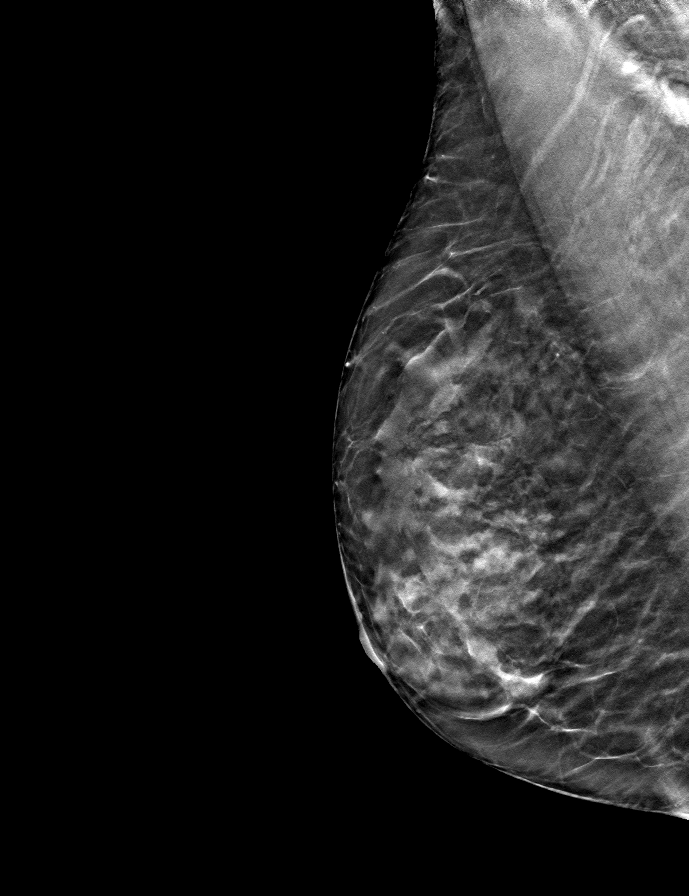

[9 of 24 positions shown; findings below may reference images not displayed]

ACR Breast Density Category c: The breast tissue is heterogeneously
dense, which may obscure small masses.
FINDINGS: There are no findings suspicious for malignancy. Images were
processed with CAD.
IMPRESSION: No mammographic evidence of malignancy. A result letter of this
screening mammogram will be mailed directly to the patient.

RECOMMENDATION:
Screening mammogram in one year. (Code:[id number])

BI-RADS CATEGORY  1: Negative.

## 2021-05-04 DIAGNOSIS — Z1389 Encounter for screening for other disorder: Secondary | ICD-10-CM | POA: Diagnosis not present

## 2021-05-04 DIAGNOSIS — Z0000 Encounter for general adult medical examination without abnormal findings: Secondary | ICD-10-CM | POA: Diagnosis not present

## 2021-05-05 DIAGNOSIS — Z8349 Family history of other endocrine, nutritional and metabolic diseases: Secondary | ICD-10-CM | POA: Diagnosis not present

## 2021-05-05 DIAGNOSIS — E7800 Pure hypercholesterolemia, unspecified: Secondary | ICD-10-CM | POA: Diagnosis not present

## 2021-05-05 DIAGNOSIS — R7303 Prediabetes: Secondary | ICD-10-CM | POA: Diagnosis not present

## 2021-05-05 DIAGNOSIS — Z1159 Encounter for screening for other viral diseases: Secondary | ICD-10-CM | POA: Diagnosis not present

## 2021-10-25 ENCOUNTER — Other Ambulatory Visit: Payer: Self-pay | Admitting: Family Medicine

## 2021-10-25 DIAGNOSIS — Z1231 Encounter for screening mammogram for malignant neoplasm of breast: Secondary | ICD-10-CM

## 2021-11-30 DIAGNOSIS — H04123 Dry eye syndrome of bilateral lacrimal glands: Secondary | ICD-10-CM | POA: Diagnosis not present

## 2021-11-30 DIAGNOSIS — H524 Presbyopia: Secondary | ICD-10-CM | POA: Diagnosis not present

## 2021-11-30 DIAGNOSIS — H18513 Endothelial corneal dystrophy, bilateral: Secondary | ICD-10-CM | POA: Diagnosis not present

## 2021-11-30 DIAGNOSIS — H52223 Regular astigmatism, bilateral: Secondary | ICD-10-CM | POA: Diagnosis not present

## 2021-11-30 DIAGNOSIS — D3131 Benign neoplasm of right choroid: Secondary | ICD-10-CM | POA: Diagnosis not present

## 2021-11-30 DIAGNOSIS — H2513 Age-related nuclear cataract, bilateral: Secondary | ICD-10-CM | POA: Diagnosis not present

## 2021-11-30 DIAGNOSIS — H18511 Endothelial corneal dystrophy, right eye: Secondary | ICD-10-CM | POA: Diagnosis not present

## 2021-11-30 DIAGNOSIS — H5203 Hypermetropia, bilateral: Secondary | ICD-10-CM | POA: Diagnosis not present

## 2021-12-06 ENCOUNTER — Ambulatory Visit
Admission: RE | Admit: 2021-12-06 | Discharge: 2021-12-06 | Disposition: A | Discharge status: Home / Self Care | Payer: PPO | Source: Ambulatory Visit | Attending: Family Medicine | Admitting: Family Medicine

## 2021-12-06 DIAGNOSIS — Z1231 Encounter for screening mammogram for malignant neoplasm of breast: Secondary | ICD-10-CM

## 2022-06-30 DIAGNOSIS — E7800 Pure hypercholesterolemia, unspecified: Secondary | ICD-10-CM | POA: Diagnosis not present

## 2022-06-30 DIAGNOSIS — R7303 Prediabetes: Secondary | ICD-10-CM | POA: Diagnosis not present

## 2022-06-30 DIAGNOSIS — Z1389 Encounter for screening for other disorder: Secondary | ICD-10-CM | POA: Diagnosis not present

## 2022-06-30 DIAGNOSIS — Z0000 Encounter for general adult medical examination without abnormal findings: Secondary | ICD-10-CM | POA: Diagnosis not present

## 2022-06-30 DIAGNOSIS — M8580 Other specified disorders of bone density and structure, unspecified site: Secondary | ICD-10-CM | POA: Diagnosis not present

## 2022-07-07 DIAGNOSIS — Z8349 Family history of other endocrine, nutritional and metabolic diseases: Secondary | ICD-10-CM | POA: Diagnosis not present

## 2022-07-07 DIAGNOSIS — R7303 Prediabetes: Secondary | ICD-10-CM | POA: Diagnosis not present

## 2022-07-07 DIAGNOSIS — J302 Other seasonal allergic rhinitis: Secondary | ICD-10-CM | POA: Diagnosis not present

## 2022-07-07 DIAGNOSIS — E7800 Pure hypercholesterolemia, unspecified: Secondary | ICD-10-CM | POA: Diagnosis not present

## 2022-07-07 DIAGNOSIS — M8580 Other specified disorders of bone density and structure, unspecified site: Secondary | ICD-10-CM | POA: Diagnosis not present

## 2022-10-31 ENCOUNTER — Other Ambulatory Visit: Payer: Self-pay | Admitting: Family Medicine

## 2022-10-31 DIAGNOSIS — Z1231 Encounter for screening mammogram for malignant neoplasm of breast: Secondary | ICD-10-CM

## 2022-12-06 DIAGNOSIS — H524 Presbyopia: Secondary | ICD-10-CM | POA: Diagnosis not present

## 2022-12-06 DIAGNOSIS — H52223 Regular astigmatism, bilateral: Secondary | ICD-10-CM | POA: Diagnosis not present

## 2022-12-06 DIAGNOSIS — H18513 Endothelial corneal dystrophy, bilateral: Secondary | ICD-10-CM | POA: Diagnosis not present

## 2022-12-06 DIAGNOSIS — D3131 Benign neoplasm of right choroid: Secondary | ICD-10-CM | POA: Diagnosis not present

## 2022-12-06 DIAGNOSIS — H25813 Combined forms of age-related cataract, bilateral: Secondary | ICD-10-CM | POA: Diagnosis not present

## 2022-12-06 DIAGNOSIS — H04123 Dry eye syndrome of bilateral lacrimal glands: Secondary | ICD-10-CM | POA: Diagnosis not present

## 2022-12-06 DIAGNOSIS — H5203 Hypermetropia, bilateral: Secondary | ICD-10-CM | POA: Diagnosis not present

## 2022-12-09 ENCOUNTER — Ambulatory Visit
Admission: RE | Admit: 2022-12-09 | Discharge: 2022-12-09 | Disposition: A | Discharge status: Home / Self Care | Payer: PPO | Source: Ambulatory Visit | Attending: Family Medicine | Admitting: Family Medicine

## 2022-12-09 DIAGNOSIS — Z1231 Encounter for screening mammogram for malignant neoplasm of breast: Secondary | ICD-10-CM | POA: Diagnosis not present

## 2023-07-04 DIAGNOSIS — M8580 Other specified disorders of bone density and structure, unspecified site: Secondary | ICD-10-CM | POA: Diagnosis not present

## 2023-07-04 DIAGNOSIS — Z1389 Encounter for screening for other disorder: Secondary | ICD-10-CM | POA: Diagnosis not present

## 2023-07-04 DIAGNOSIS — E7800 Pure hypercholesterolemia, unspecified: Secondary | ICD-10-CM | POA: Diagnosis not present

## 2023-07-04 DIAGNOSIS — Z6820 Body mass index (BMI) 20.0-20.9, adult: Secondary | ICD-10-CM | POA: Diagnosis not present

## 2023-07-04 DIAGNOSIS — R7303 Prediabetes: Secondary | ICD-10-CM | POA: Diagnosis not present

## 2023-07-04 DIAGNOSIS — Z0000 Encounter for general adult medical examination without abnormal findings: Secondary | ICD-10-CM | POA: Diagnosis not present

## 2023-07-04 DIAGNOSIS — Z8349 Family history of other endocrine, nutritional and metabolic diseases: Secondary | ICD-10-CM | POA: Diagnosis not present

## 2023-07-04 DIAGNOSIS — J302 Other seasonal allergic rhinitis: Secondary | ICD-10-CM | POA: Diagnosis not present

## 2023-10-27 ENCOUNTER — Other Ambulatory Visit: Payer: Self-pay | Admitting: Family Medicine

## 2023-10-27 DIAGNOSIS — Z0000 Encounter for general adult medical examination without abnormal findings: Secondary | ICD-10-CM

## 2023-12-11 ENCOUNTER — Ambulatory Visit
Admission: RE | Admit: 2023-12-11 | Discharge: 2023-12-11 | Disposition: A | Discharge status: Home / Self Care | Payer: PPO | Source: Ambulatory Visit | Attending: Family Medicine | Admitting: Family Medicine

## 2023-12-11 DIAGNOSIS — Z1231 Encounter for screening mammogram for malignant neoplasm of breast: Secondary | ICD-10-CM | POA: Diagnosis not present

## 2023-12-11 DIAGNOSIS — Z0000 Encounter for general adult medical examination without abnormal findings: Secondary | ICD-10-CM

## 2023-12-19 DIAGNOSIS — H524 Presbyopia: Secondary | ICD-10-CM | POA: Diagnosis not present

## 2023-12-19 DIAGNOSIS — H25813 Combined forms of age-related cataract, bilateral: Secondary | ICD-10-CM | POA: Diagnosis not present

## 2023-12-19 DIAGNOSIS — D3131 Benign neoplasm of right choroid: Secondary | ICD-10-CM | POA: Diagnosis not present

## 2023-12-19 DIAGNOSIS — H04123 Dry eye syndrome of bilateral lacrimal glands: Secondary | ICD-10-CM | POA: Diagnosis not present

## 2023-12-19 DIAGNOSIS — H52223 Regular astigmatism, bilateral: Secondary | ICD-10-CM | POA: Diagnosis not present

## 2023-12-19 DIAGNOSIS — H18513 Endothelial corneal dystrophy, bilateral: Secondary | ICD-10-CM | POA: Diagnosis not present

## 2023-12-19 DIAGNOSIS — H5203 Hypermetropia, bilateral: Secondary | ICD-10-CM | POA: Diagnosis not present

## 2024-07-05 ENCOUNTER — Encounter: Payer: Self-pay | Admitting: Advanced Practice Midwife

## 2024-09-05 DIAGNOSIS — Z0000 Encounter for general adult medical examination without abnormal findings: Secondary | ICD-10-CM | POA: Diagnosis not present

## 2024-09-05 DIAGNOSIS — M8580 Other specified disorders of bone density and structure, unspecified site: Secondary | ICD-10-CM | POA: Diagnosis not present

## 2024-09-05 DIAGNOSIS — Z8349 Family history of other endocrine, nutritional and metabolic diseases: Secondary | ICD-10-CM | POA: Diagnosis not present

## 2024-09-05 DIAGNOSIS — Z6821 Body mass index (BMI) 21.0-21.9, adult: Secondary | ICD-10-CM | POA: Diagnosis not present

## 2024-09-05 DIAGNOSIS — E673 Hypervitaminosis D: Secondary | ICD-10-CM | POA: Diagnosis not present

## 2024-09-05 DIAGNOSIS — E7800 Pure hypercholesterolemia, unspecified: Secondary | ICD-10-CM | POA: Diagnosis not present

## 2024-09-05 DIAGNOSIS — R7303 Prediabetes: Secondary | ICD-10-CM | POA: Diagnosis not present

## 2024-10-24 DIAGNOSIS — M810 Age-related osteoporosis without current pathological fracture: Secondary | ICD-10-CM | POA: Diagnosis not present

## 2024-11-08 ENCOUNTER — Other Ambulatory Visit: Payer: Self-pay | Admitting: Family Medicine

## 2024-11-08 DIAGNOSIS — Z1231 Encounter for screening mammogram for malignant neoplasm of breast: Secondary | ICD-10-CM

## 2024-12-16 ENCOUNTER — Ambulatory Visit
Admission: RE | Admit: 2024-12-16 | Discharge: 2024-12-16 | Disposition: A | Source: Ambulatory Visit | Attending: Family Medicine | Admitting: Family Medicine

## 2024-12-16 DIAGNOSIS — Z1231 Encounter for screening mammogram for malignant neoplasm of breast: Secondary | ICD-10-CM
